# Patient Record
Sex: Female | Born: 1975 | Race: Black or African American | Hispanic: No | Marital: Married | Smoking: Never smoker
Health system: Southern US, Community
[De-identification: ages and names within clinical notes are randomized; demographics above are authoritative.]

## PROBLEM LIST (undated history)

## (undated) DIAGNOSIS — Z9289 Personal history of other medical treatment: Secondary | ICD-10-CM

## (undated) DIAGNOSIS — F419 Anxiety disorder, unspecified: Secondary | ICD-10-CM

## (undated) DIAGNOSIS — D649 Anemia, unspecified: Secondary | ICD-10-CM

## (undated) HISTORY — PX: LAPAROSCOPY: SHX197

## (undated) HISTORY — PX: DILATION AND CURETTAGE OF UTERUS: SHX78

---
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
## 2003-02-19 ENCOUNTER — Encounter: Admission: RE | Admit: 2003-02-19 | Discharge: 2003-02-19 | Payer: Self-pay | Admitting: Obstetrics and Gynecology

## 2005-01-26 ENCOUNTER — Other Ambulatory Visit: Admission: RE | Admit: 2005-01-26 | Discharge: 2005-01-26 | Payer: Self-pay | Admitting: Family Medicine

## 2006-02-08 ENCOUNTER — Other Ambulatory Visit: Admission: RE | Admit: 2006-02-08 | Discharge: 2006-02-08 | Payer: Self-pay | Admitting: Family Medicine

## 2007-03-21 ENCOUNTER — Other Ambulatory Visit: Admission: RE | Admit: 2007-03-21 | Discharge: 2007-03-21 | Payer: Self-pay | Admitting: Internal Medicine

## 2008-03-31 ENCOUNTER — Other Ambulatory Visit: Admission: RE | Admit: 2008-03-31 | Discharge: 2008-03-31 | Payer: Self-pay | Admitting: Obstetrics and Gynecology

## 2009-05-06 ENCOUNTER — Other Ambulatory Visit: Admission: RE | Admit: 2009-05-06 | Discharge: 2009-05-06 | Payer: Self-pay | Admitting: Obstetrics and Gynecology

## 2009-06-24 ENCOUNTER — Other Ambulatory Visit: Admission: RE | Admit: 2009-06-24 | Discharge: 2009-06-24 | Payer: Self-pay | Admitting: Obstetrics and Gynecology

## 2010-06-10 ENCOUNTER — Other Ambulatory Visit
Admission: RE | Admit: 2010-06-10 | Discharge: 2010-06-10 | Payer: Self-pay | Source: Home / Self Care | Admitting: Obstetrics and Gynecology

## 2010-08-08 ENCOUNTER — Other Ambulatory Visit: Payer: Self-pay | Admitting: Obstetrics and Gynecology

## 2010-08-08 DIAGNOSIS — Z1231 Encounter for screening mammogram for malignant neoplasm of breast: Secondary | ICD-10-CM

## 2010-08-18 ENCOUNTER — Ambulatory Visit
Admission: RE | Admit: 2010-08-18 | Discharge: 2010-08-18 | Disposition: A | Discharge status: Home / Self Care | Payer: PRIVATE HEALTH INSURANCE | Source: Ambulatory Visit | Attending: Obstetrics and Gynecology | Admitting: Obstetrics and Gynecology

## 2010-08-18 DIAGNOSIS — Z1231 Encounter for screening mammogram for malignant neoplasm of breast: Secondary | ICD-10-CM

## 2010-09-15 ENCOUNTER — Other Ambulatory Visit: Payer: Self-pay | Admitting: Obstetrics and Gynecology

## 2010-09-15 DIAGNOSIS — Z1231 Encounter for screening mammogram for malignant neoplasm of breast: Secondary | ICD-10-CM

## 2010-10-14 NOTE — Group Therapy Note (Signed)
NAME:  Desiree [name], MIRANDA NO.:  [id number]   MEDICAL RECORD NO.:  [id number]                   PATIENT TYPE:  OUT   LOCATION:  [facility]                           FACILITY:  [facility]   PHYSICIAN:  Tinnie Gens, MD                     DATE OF BIRTH:  1975-08-29   DATE OF SERVICE:  02/19/2003                                    CLINIC NOTE   CHIEF COMPLAINT:  Undesired fertility.   HISTORY OF PRESENT ILLNESS:  The patient is a 35 year old gravida 4 para 2-0-  2-2 who comes in today for definitive therapy for her persistent fertility.  She has been counseled at length about the permanency of this procedure and  risks and continues to desire sterilization.   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   PAST SURGICAL HISTORY:  She had laparoscopic surgery for possible PID/TOA  and she had cervical biopsies for abnormal Pap smear.   ALLERGIES:  SHELLFISH.   CURRENT MEDICATIONS:  Ortho-Novum 7/7/7.   GYNECOLOGICAL HISTORY:  Menarche is at age 65.  She has normal cycles on  OCs.  She has a history of abnormal Pap status post biopsy but no other  treatment for this.  Her last Pap smear was in August 2003 and within normal  limits.   OBSTETRICAL HISTORY:  She is a G4 P0-2-2 who had two previous vaginal  deliveries, the last of which was 15 months ago.   FAMILY HISTORY:  Significant for a mother with lupus and asthma but she is  most recently deceased with a CVA.   SOCIAL HISTORY:  She does not smoke.  She does drink alcohol on a weekly  basis.  Denies other drug use.   REVIEW OF SYSTEMS:  A 14-point review of systems is reviewed and is positive  for some fatigue and weight gain as well as vaginal itching.  She denies  nausea, vomiting, fevers, chills, chest pain, shortness of breath, abdominal  pain, diarrhea, constipation,  dysuria, hematuria, lower extremity swelling,  headaches.   PHYSICAL EXAMINATION:  VITAL SIGNS:  Pulse 75, blood pressure 114/73, weight  is 136.4.  GENERAL:  She is a well-developed, well-nourished African-American female in  no acute distress.  LUNGS:  Clear bilaterally.  CARDIOVASCULAR:  Regular rate and rhythm.  No rubs, gallops, or murmurs.  NECK:  Supple, normal thyroid.  ABDOMEN:  Soft, nontender, nondistended.  There is a well-healed scar at the  umbilicus.  GENITOURINARY:  She has normal external female genitalia.  The cervix is  visualized and is without lesion.  The vagina is rugated and clean.  The  uterus is small and anteverted.  The adnexa are without mass or tenderness.  EXTREMITIES:  Without cyanosis, clubbing, or edema.   IMPRESSION:  1. Multiparity.  2. Undesired  fertility.   PLAN:  Laparoscopic BTL.  The patient was counseled regarding the risks and  benefits of this procedure including the permanency of procedure, risk of  failure 1 in 200, increased risk of ectopic, as well as general risks and  benefits of surgery.  She does report that her 30-day papers are signed and  we will get this scheduled in approximately two weeks.                                               Tinnie Gens, MD    TP/MEDQ  D:  02/20/2003  T:  02/20/2003  Job:  [id number]

## 2011-06-29 ENCOUNTER — Other Ambulatory Visit: Payer: Self-pay | Admitting: Obstetrics and Gynecology

## 2011-06-29 ENCOUNTER — Other Ambulatory Visit (HOSPITAL_COMMUNITY)
Admission: RE | Admit: 2011-06-29 | Discharge: 2011-06-29 | Disposition: A | Discharge status: Home / Self Care | Payer: PRIVATE HEALTH INSURANCE | Source: Ambulatory Visit | Attending: Obstetrics and Gynecology | Admitting: Obstetrics and Gynecology

## 2011-06-29 DIAGNOSIS — Z113 Encounter for screening for infections with a predominantly sexual mode of transmission: Secondary | ICD-10-CM | POA: Insufficient documentation

## 2011-06-29 DIAGNOSIS — Z01419 Encounter for gynecological examination (general) (routine) without abnormal findings: Secondary | ICD-10-CM | POA: Insufficient documentation

## 2012-02-13 ENCOUNTER — Ambulatory Visit (HOSPITAL_COMMUNITY): Admission: RE | Admit: 2012-02-13 | Payer: PRIVATE HEALTH INSURANCE | Source: Ambulatory Visit

## 2012-02-13 ENCOUNTER — Emergency Department (HOSPITAL_COMMUNITY)
Admission: EM | Admit: 2012-02-13 | Discharge: 2012-02-13 | Discharge status: Against Medical Advice | Payer: Self-pay | Attending: Emergency Medicine | Admitting: Emergency Medicine

## 2012-02-13 ENCOUNTER — Encounter (HOSPITAL_COMMUNITY): Payer: Self-pay | Admitting: *Deleted

## 2012-02-13 DIAGNOSIS — M25529 Pain in unspecified elbow: Secondary | ICD-10-CM | POA: Insufficient documentation

## 2012-02-13 HISTORY — DX: Anxiety disorder, unspecified: F41.9
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
## 2012-02-13 NOTE — ED Notes (Signed)
The pt never returned

## 2012-02-13 NOTE — ED Notes (Signed)
The pt walked out of her room and into the hallway.  /// not coming back???????
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
## 2012-06-20 ENCOUNTER — Other Ambulatory Visit (HOSPITAL_COMMUNITY)
Admission: RE | Admit: 2012-06-20 | Discharge: 2012-06-20 | Disposition: A | Discharge status: Home / Self Care | Payer: BC Managed Care – PPO | Source: Ambulatory Visit | Attending: Obstetrics and Gynecology | Admitting: Obstetrics and Gynecology

## 2012-06-20 ENCOUNTER — Other Ambulatory Visit: Payer: Self-pay | Admitting: Obstetrics and Gynecology

## 2012-06-20 DIAGNOSIS — N760 Acute vaginitis: Secondary | ICD-10-CM | POA: Insufficient documentation

## 2012-06-20 DIAGNOSIS — Z1151 Encounter for screening for human papillomavirus (HPV): Secondary | ICD-10-CM | POA: Insufficient documentation

## 2012-06-20 DIAGNOSIS — Z01419 Encounter for gynecological examination (general) (routine) without abnormal findings: Secondary | ICD-10-CM | POA: Insufficient documentation

## 2012-06-20 DIAGNOSIS — Z113 Encounter for screening for infections with a predominantly sexual mode of transmission: Secondary | ICD-10-CM | POA: Insufficient documentation

## 2013-08-14 ENCOUNTER — Other Ambulatory Visit (HOSPITAL_COMMUNITY)
Admission: RE | Admit: 2013-08-14 | Discharge: 2013-08-14 | Disposition: A | Discharge status: Home / Self Care | Payer: BC Managed Care – PPO | Source: Ambulatory Visit | Attending: Obstetrics and Gynecology | Admitting: Obstetrics and Gynecology

## 2013-08-14 ENCOUNTER — Other Ambulatory Visit: Payer: Self-pay | Admitting: Obstetrics and Gynecology

## 2013-08-14 DIAGNOSIS — Z01419 Encounter for gynecological examination (general) (routine) without abnormal findings: Secondary | ICD-10-CM | POA: Insufficient documentation

## 2014-02-10 ENCOUNTER — Encounter (HOSPITAL_COMMUNITY): Payer: Self-pay | Admitting: *Deleted

## 2014-02-11 ENCOUNTER — Other Ambulatory Visit: Payer: Self-pay | Admitting: Obstetrics and Gynecology
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
## 2014-02-12 ENCOUNTER — Ambulatory Visit (HOSPITAL_COMMUNITY): Payer: BC Managed Care – PPO | Admitting: Anesthesiology

## 2014-02-12 ENCOUNTER — Ambulatory Visit (HOSPITAL_COMMUNITY)
Admission: RE | Admit: 2014-02-12 | Discharge: 2014-02-12 | Disposition: A | Discharge status: Home / Self Care | Payer: BC Managed Care – PPO | Source: Ambulatory Visit | Attending: Obstetrics and Gynecology | Admitting: Obstetrics and Gynecology

## 2014-02-12 ENCOUNTER — Encounter (HOSPITAL_COMMUNITY): Payer: Self-pay | Admitting: Anesthesiology

## 2014-02-12 ENCOUNTER — Encounter (HOSPITAL_COMMUNITY): Payer: BC Managed Care – PPO | Admitting: Anesthesiology

## 2014-02-12 ENCOUNTER — Encounter (HOSPITAL_COMMUNITY)
Admission: RE | Disposition: A | Discharge status: Home / Self Care | Payer: Self-pay | Source: Ambulatory Visit | Attending: Obstetrics and Gynecology

## 2014-02-12 DIAGNOSIS — N736 Female pelvic peritoneal adhesions (postinfective): Secondary | ICD-10-CM | POA: Insufficient documentation

## 2014-02-12 DIAGNOSIS — D649 Anemia, unspecified: Secondary | ICD-10-CM | POA: Insufficient documentation

## 2014-02-12 DIAGNOSIS — N949 Unspecified condition associated with female genital organs and menstrual cycle: Secondary | ICD-10-CM | POA: Diagnosis present

## 2014-02-12 DIAGNOSIS — N939 Abnormal uterine and vaginal bleeding, unspecified: Secondary | ICD-10-CM

## 2014-02-12 DIAGNOSIS — N925 Other specified irregular menstruation: Secondary | ICD-10-CM | POA: Diagnosis present

## 2014-02-12 DIAGNOSIS — N938 Other specified abnormal uterine and vaginal bleeding: Secondary | ICD-10-CM | POA: Diagnosis not present

## 2014-02-12 HISTORY — PX: LAPAROSCOPIC TUBAL LIGATION: SHX1937

## 2014-02-12 HISTORY — DX: Anemia, unspecified: D64.9

## 2014-02-12 HISTORY — PX: LAPAROSCOPIC LYSIS OF ADHESIONS: SHX5905

## 2014-02-12 HISTORY — PX: HYSTEROSCOPY: SHX211

## 2014-02-12 HISTORY — DX: Personal history of other medical treatment: Z92.89

## 2014-02-12 LAB — CBC
HEMATOCRIT: 36 % (ref 36.0–46.0)
HEMOGLOBIN: 12.3 g/dL (ref 12.0–15.0)
MCH: 32.4 pg (ref 26.0–34.0)
MCHC: 34.2 g/dL (ref 30.0–36.0)
MCV: 94.7 fL (ref 78.0–100.0)
Platelets: 272 10*3/uL (ref 150–400)
RBC: 3.8 MIL/uL — ABNORMAL LOW (ref 3.87–5.11)
RDW: 12.6 % (ref 11.5–15.5)
WBC: 6.8 10*3/uL (ref 4.0–10.5)

## 2014-02-12 LAB — PREGNANCY, URINE: PREG TEST UR: NEGATIVE

## 2014-02-12 SURGERY — LIGATION, FALLOPIAN TUBE, LAPAROSCOPIC
Anesthesia: General | Site: Vagina

## 2014-02-12 MED ORDER — DEXAMETHASONE SODIUM PHOSPHATE 4 MG/ML IJ SOLN
INTRAMUSCULAR | Status: AC
  Filled 2014-02-12: qty 2

## 2014-02-12 MED ORDER — FENTANYL CITRATE 0.05 MG/ML IJ SOLN
INTRAMUSCULAR | Status: AC
  Filled 2014-02-12: qty 2

## 2014-02-12 MED ORDER — KETOROLAC TROMETHAMINE 30 MG/ML IJ SOLN
INTRAMUSCULAR | Status: DC | PRN
  Administered 2014-02-12: 30 mg via INTRAVENOUS

## 2014-02-12 MED ORDER — OXYCODONE HCL 5 MG PO TABS: 5.0000 mg | ORAL_TABLET | Freq: Once | ORAL | Status: DC | PRN

## 2014-02-12 MED ORDER — MEPERIDINE HCL 25 MG/ML IJ SOLN: 6.2500 mg | INTRAMUSCULAR | Status: DC | PRN

## 2014-02-12 MED ORDER — HYDROCODONE-ACETAMINOPHEN 5-325 MG PO TABS: 1.0000 | ORAL_TABLET | Freq: Four times a day (QID) | ORAL | Status: DC | PRN

## 2014-02-12 MED ORDER — ROCURONIUM BROMIDE 100 MG/10ML IV SOLN
INTRAVENOUS | Status: AC
  Filled 2014-02-12: qty 1

## 2014-02-12 MED ORDER — FENTANYL CITRATE 0.05 MG/ML IJ SOLN
INTRAMUSCULAR | Status: DC | PRN
  Administered 2014-02-12: 50 ug via INTRAVENOUS
  Administered 2014-02-12: 100 ug via INTRAVENOUS
  Administered 2014-02-12 (×2): 50 ug via INTRAVENOUS

## 2014-02-12 MED ORDER — NEOSTIGMINE METHYLSULFATE 10 MG/10ML IV SOLN
INTRAVENOUS | Status: AC
  Filled 2014-02-12: qty 1

## 2014-02-12 MED ORDER — LACTATED RINGERS IV SOLN
INTRAVENOUS | Status: DC
  Administered 2014-02-12 (×2): via INTRAVENOUS

## 2014-02-12 MED ORDER — SCOPOLAMINE 1 MG/3DAYS TD PT72: 1.0000 | MEDICATED_PATCH | Freq: Once | TRANSDERMAL | Status: DC

## 2014-02-12 MED ORDER — DIPHENHYDRAMINE HCL 50 MG/ML IJ SOLN
INTRAMUSCULAR | Status: DC | PRN
  Administered 2014-02-12: 12.5 mg via INTRAVENOUS

## 2014-02-12 MED ORDER — LIDOCAINE HCL (CARDIAC) 20 MG/ML IV SOLN
INTRAVENOUS | Status: AC
  Filled 2014-02-12: qty 5

## 2014-02-12 MED ORDER — PROPOFOL 10 MG/ML IV EMUL
INTRAVENOUS | Status: AC
  Filled 2014-02-12: qty 20

## 2014-02-12 MED ORDER — FENTANYL CITRATE 0.05 MG/ML IJ SOLN
INTRAMUSCULAR | Status: AC
  Filled 2014-02-12: qty 5

## 2014-02-12 MED ORDER — DIPHENHYDRAMINE HCL 50 MG/ML IJ SOLN
INTRAMUSCULAR | Status: AC
  Filled 2014-02-12: qty 2

## 2014-02-12 MED ORDER — GLYCOPYRROLATE 0.2 MG/ML IJ SOLN
INTRAMUSCULAR | Status: AC
  Filled 2014-02-12: qty 3

## 2014-02-12 MED ORDER — SCOPOLAMINE 1 MG/3DAYS TD PT72
MEDICATED_PATCH | TRANSDERMAL | Status: DC
Start: 2014-02-12 — End: 2014-02-12
  Administered 2014-02-12: 1.5 mg via TRANSDERMAL
  Filled 2014-02-12: qty 1

## 2014-02-12 MED ORDER — BUPIVACAINE HCL (PF) 0.25 % IJ SOLN
INTRAMUSCULAR | Status: DC | PRN
  Administered 2014-02-12: 10 mL

## 2014-02-12 MED ORDER — IBUPROFEN 800 MG PO TABS: 800.0000 mg | ORAL_TABLET | Freq: Three times a day (TID) | ORAL | Status: DC | PRN

## 2014-02-12 MED ORDER — GLYCOPYRROLATE 0.2 MG/ML IJ SOLN
INTRAMUSCULAR | Status: DC | PRN
  Administered 2014-02-12: 0.4 mg via INTRAVENOUS
  Administered 2014-02-12: 0.6 mg via INTRAVENOUS

## 2014-02-12 MED ORDER — MIDAZOLAM HCL 2 MG/2ML IJ SOLN
INTRAMUSCULAR | Status: DC | PRN
  Administered 2014-02-12: 2 mg via INTRAVENOUS

## 2014-02-12 MED ORDER — HYDROCODONE-ACETAMINOPHEN 5-325 MG PO TABS
ORAL_TABLET | ORAL | Status: AC
  Filled 2014-02-12: qty 1

## 2014-02-12 MED ORDER — ONDANSETRON HCL 4 MG/2ML IJ SOLN
INTRAMUSCULAR | Status: AC
  Filled 2014-02-12: qty 2

## 2014-02-12 MED ORDER — ROCURONIUM BROMIDE 100 MG/10ML IV SOLN
INTRAVENOUS | Status: DC | PRN
  Administered 2014-02-12: 35 mg via INTRAVENOUS
  Administered 2014-02-12: 15 mg via INTRAVENOUS

## 2014-02-12 MED ORDER — ONDANSETRON HCL 4 MG/2ML IJ SOLN
INTRAMUSCULAR | Status: DC | PRN
  Administered 2014-02-12: 4 mg via INTRAVENOUS

## 2014-02-12 MED ORDER — SCOPOLAMINE 1 MG/3DAYS TD PT72
1.0000 | MEDICATED_PATCH | TRANSDERMAL | Status: DC
  Administered 2014-02-12: 1.5 mg via TRANSDERMAL

## 2014-02-12 MED ORDER — FENTANYL CITRATE 0.05 MG/ML IJ SOLN
25.0000 ug | INTRAMUSCULAR | Status: DC | PRN
  Administered 2014-02-12: 25 ug via INTRAVENOUS

## 2014-02-12 MED ORDER — HYDROMORPHONE HCL 1 MG/ML IJ SOLN
INTRAMUSCULAR | Status: AC
  Filled 2014-02-12: qty 1

## 2014-02-12 MED ORDER — HYDROCODONE-ACETAMINOPHEN 5-325 MG PO TABS
1.0000 | ORAL_TABLET | Freq: Once | ORAL | Status: AC
  Administered 2014-02-12: 1 via ORAL

## 2014-02-12 MED ORDER — KETOROLAC TROMETHAMINE 30 MG/ML IJ SOLN
INTRAMUSCULAR | Status: AC
  Filled 2014-02-12: qty 1

## 2014-02-12 MED ORDER — MIDAZOLAM HCL 2 MG/2ML IJ SOLN
INTRAMUSCULAR | Status: AC
  Filled 2014-02-12: qty 2

## 2014-02-12 MED ORDER — PROPOFOL 10 MG/ML IV BOLUS
INTRAVENOUS | Status: DC | PRN
  Administered 2014-02-12: 170 mg via INTRAVENOUS

## 2014-02-12 MED ORDER — OXYCODONE HCL 5 MG/5ML PO SOLN: 5.0000 mg | Freq: Once | ORAL | Status: DC | PRN

## 2014-02-12 MED ORDER — DOXYCYCLINE HYCLATE 50 MG PO CAPS: 100.0000 mg | ORAL_CAPSULE | Freq: Every day | ORAL | Status: DC

## 2014-02-12 MED ORDER — METOCLOPRAMIDE HCL 5 MG/ML IJ SOLN: 10.0000 mg | Freq: Once | INTRAMUSCULAR | Status: DC | PRN

## 2014-02-12 MED ORDER — DEXAMETHASONE SODIUM PHOSPHATE 10 MG/ML IJ SOLN
INTRAMUSCULAR | Status: DC | PRN
  Administered 2014-02-12: 4 mg via INTRAVENOUS

## 2014-02-12 MED ORDER — HYDROMORPHONE HCL 1 MG/ML IJ SOLN
INTRAMUSCULAR | Status: DC | PRN
  Administered 2014-02-12: 1 mg via INTRAVENOUS

## 2014-02-12 MED ORDER — NEOSTIGMINE METHYLSULFATE 10 MG/10ML IV SOLN
INTRAVENOUS | Status: DC | PRN
  Administered 2014-02-12: 2 mg via INTRAVENOUS
  Administered 2014-02-12: 3 mg via INTRAVENOUS

## 2014-02-12 MED ORDER — DEXAMETHASONE SODIUM PHOSPHATE 10 MG/ML IJ SOLN
INTRAMUSCULAR | Status: AC
  Filled 2014-02-12: qty 1

## 2014-02-12 MED ORDER — LIDOCAINE HCL (CARDIAC) 20 MG/ML IV SOLN
INTRAVENOUS | Status: DC | PRN
  Administered 2014-02-12: 80 mg via INTRAVENOUS

## 2014-02-12 MED ORDER — LACTATED RINGERS IR SOLN
Status: DC | PRN
  Administered 2014-02-12: 3000 mL

## 2014-02-12 SURGICAL SUPPLY — 33 items
ADH SKN CLS APL DERMABOND .7 (GAUZE/BANDAGES/DRESSINGS) ×3
APPLICATOR COTTON TIP 6IN STRL (MISCELLANEOUS) ×5 IMPLANT
CANISTER SUCT 3000ML (MISCELLANEOUS) ×5 IMPLANT
CATH ROBINSON RED A/P 16FR (CATHETERS) ×5 IMPLANT
CLOTH BEACON ORANGE TIMEOUT ST (SAFETY) ×5 IMPLANT
CONTAINER PREFILL 10% NBF 60ML (FORM) ×10 IMPLANT
DERMABOND ADVANCED (GAUZE/BANDAGES/DRESSINGS) ×2
DERMABOND ADVANCED .7 DNX12 (GAUZE/BANDAGES/DRESSINGS) ×3 IMPLANT
DILATOR CANAL MILEX (MISCELLANEOUS) IMPLANT
DRAPE HYSTEROSCOPY (DRAPE) ×5 IMPLANT
DRSG COVADERM PLUS 2X2 (GAUZE/BANDAGES/DRESSINGS) ×10 IMPLANT
DRSG OPSITE POSTOP 3X4 (GAUZE/BANDAGES/DRESSINGS) ×2 IMPLANT
ELECT REM PT RETURN 9FT ADLT (ELECTROSURGICAL)
ELECTRODE REM PT RTRN 9FT ADLT (ELECTROSURGICAL) IMPLANT
FORCEPS CUTTING 45CM 5MM (CUTTING FORCEPS) ×5 IMPLANT
GLOVE BIOGEL M 6.5 STRL (GLOVE) ×10 IMPLANT
GLOVE BIOGEL PI IND STRL 6.5 (GLOVE) ×3 IMPLANT
GLOVE BIOGEL PI INDICATOR 6.5 (GLOVE) ×2
GOWN STRL REUS W/TWL LRG LVL3 (GOWN DISPOSABLE) ×10 IMPLANT
PACK LAPAROSCOPY BASIN (CUSTOM PROCEDURE TRAY) ×5 IMPLANT
PACK VAGINAL MINOR WOMEN LF (CUSTOM PROCEDURE TRAY) ×5 IMPLANT
PAD OB MATERNITY 4.3X12.25 (PERSONAL CARE ITEMS) ×5 IMPLANT
SCISSORS LAP 5X35 DISP (ENDOMECHANICALS) ×2 IMPLANT
SET GENESYS HTA PROCERVA (MISCELLANEOUS) IMPLANT
SET IRRIG TUBING LAPAROSCOPIC (IRRIGATION / IRRIGATOR) ×2 IMPLANT
SOLUTION ELECTROLUBE (MISCELLANEOUS) ×5 IMPLANT
SUT VIC AB 4-0 PS2 18 (SUTURE) ×2 IMPLANT
SUT VICRYL 0 UR6 27IN ABS (SUTURE) ×5 IMPLANT
SUT VICRYL 4-0 PS2 18IN ABS (SUTURE) ×5 IMPLANT
TOWEL OR 17X24 6PK STRL BLUE (TOWEL DISPOSABLE) ×10 IMPLANT
TROCAR XCEL NON-BLD 11X100MML (ENDOMECHANICALS) ×5 IMPLANT
WARMER LAPAROSCOPE (MISCELLANEOUS) ×5 IMPLANT
WATER STERILE IRR 1000ML POUR (IV SOLUTION) ×5 IMPLANT

## 2014-02-12 NOTE — Anesthesia Preprocedure Evaluation (Addendum)
Anesthesia Evaluation  Patient identified by MRN, date of birth, ID band Patient awake    Reviewed: Allergy & Precautions, H&P , NPO status , Patient's Chart, lab work & pertinent test results  Airway Mallampati: II TM Distance: >3 FB Neck ROM: full    Dental  (+) Teeth Intact   Pulmonary neg pulmonary ROS,  breath sounds clear to auscultation        Cardiovascular negative cardio ROS  Rhythm:regular Rate:Normal     Neuro/Psych Anxiety negative neurological ROS  negative psych ROS   GI/Hepatic negative GI ROS, Neg liver ROS,   Endo/Other  negative endocrine ROS  Renal/GU negative Renal ROS  negative genitourinary   Musculoskeletal negative musculoskeletal ROS (+)   Abdominal   Peds  Hematology negative hematology ROS (+) anemia ,   Anesthesia Other Findings   Reproductive/Obstetrics negative OB ROS AUB Desires permanent contraception                          Anesthesia Physical Anesthesia Plan  ASA: II  Anesthesia Plan: General   Post-op Pain Management:    Induction: Intravenous  Airway Management Planned: Oral ETT  Additional Equipment:   Intra-op Plan:   Post-operative Plan: Extubation in OR  Informed Consent: I have reviewed the patients History and Physical, chart, labs and discussed the procedure including the risks, benefits and alternatives for the proposed anesthesia with the patient or authorized representative who has indicated his/her understanding and acceptance.   Dental advisory given  Plan Discussed with: Anesthesiologist, CRNA and Surgeon  Anesthesia Plan Comments:         Anesthesia Quick Evaluation

## 2014-02-12 NOTE — H&P (Signed)
Date of Initial H&P: 02/11/2014  History reviewed, patient examined, no change in status, stable for surgery. Discussed with patient plan for currettage after ablation to r/o malignancy.. She is in agreement.

## 2014-02-12 NOTE — Anesthesia Postprocedure Evaluation (Signed)
  Anesthesia Post-op Note  Patient: Desiree [name]  Procedure(s) Performed: Procedure(s): ATTEMPTED  LAPAROSCOPIC TUBAL LIGATION (Bilateral) HYSTEROSCOPY WITH HYDROTHERMAL ABLATION (N/A) EXTENSIVE LAPAROSCOPIC LYSIS OF ADHESIONS (N/A)  Patient Location: PACU  Anesthesia Type:General  Level of Consciousness: awake, alert  and oriented  Airway and Oxygen Therapy: Patient Spontanous Breathing  Post-op Pain: mild  Post-op Assessment: Post-op Vital signs reviewed, Patient's Cardiovascular Status Stable, Respiratory Function Stable, Patent Airway, No signs of Nausea or vomiting and Pain level controlled  Post-op Vital Signs: Reviewed and stable  Last Vitals:  Filed Vitals:   02/12/14 1140  BP: 128/82  Pulse: 76  Temp: 36.7 C  Resp: 16    Complications: No apparent anesthesia complications

## 2014-02-12 NOTE — Op Note (Signed)
02/12/2014  9:40 AM  PATIENT:  Desiree [name]  38 y.o. female  PRE-OPERATIVE DIAGNOSIS:  Contraceptive Management/AUB  POST-OPERATIVE DIAGNOSIS:  Contraceptive Management/AUB  PROCEDURE:  Procedure(s): ATTEMPTED  LAPAROSCOPIC TUBAL LIGATION (Bilateral) HYSTEROSCOPY WITH HYDROTHERMAL ABLATION (N/A) EXTENSIVE LAPAROSCOPIC LYSIS OF ADHESIONS (N/A)  SURGEON:  Surgeon(s) and Role:    * Zahria Ding J. Richardson Dopp, MD - Primary  PHYSICIAN ASSISTANT: None  ASSISTANTS: Kristen   ANESTHESIA:   general  EBL: 20 cc   Total I/O In: 1000 [I.V.:1000] Out: 320 [Urine:300; Blood:20]  BLOOD ADMINISTERED:none  DRAINS: none   LOCAL MEDICATIONS USED:  MARCAINE     SPECIMEN:  Source of Specimen:  endometrial currettings   DISPOSITION OF SPECIMEN:  PATHOLOGY  COUNTS:  correcct  TOURNIQUET:  * No tourniquets in log *  DICTATION: .Dragon Dictation  PLAN OF CARE: Discharge to home after PACU  PATIENT DISPOSITION:  PACU - hemodynamically stable.   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 I was unable to visualize the uterus ovaries and fallopian tubes due to the dense adhesions. The endometrium was slightly proliferative,   Procedure: The patient was taken to the operating room where she was placed under general anesthesia. She is placed in the dorsolithotomy position. She was prepped and draped in the usual sterile fashion. Speculum was placed into the vaginal vault. In the anterior lip of the cervix grasped with a single-tooth tenaculum. A uterine manipulator was placed without difficulty. The single-tooth tenaculum was  Removed. The speculum was removed.  Attention was turned to the umbilicus which was injected with 10 cc of quarter percent Marcaine. A 11 mm incision was made with the scalpel. A 11 mm trocar was placed under  direct visualization. Entry into the peritoneum was visualized. The pneumoperitoneum was achieved with CO2 gas.The adhesions were noted as mentioned above. A 5 mm incision was made in the left lower quadrant. A 5 mm trocar was placed under direct visualization. Laparoscope scissors were used to excise a majority of the pelvic adhesions. The Adhesions of the bowel to the anterior abdominal wall in the lower pelvis were dense. i decided to abort the attempt of tubal ligation due to the dense adhesions. The pelvis was irrigated.Marland Kitchen Hemostasis was noted.    Pneumoperitoneum was released. The left lower quadrant trocar and the  umbilical trocar was removed under direct visualization. The fascia  Of the umbilcal incision was closed with 0 Vicryl. The skin was closed with 4-0 Vicryl. The dermabond placed a over the skin incision.   Attention was turned to the vagina. A speculum was placed in the vaginal vault. The uterine manipulator was removed. The anterior lip of the cervix was grasped with a single tooth tenaculum. The uterus was sounded to 8 cm. The cervix was dilated to 8 mm. The hydrothermal hysteroscope was inserted. The ostia were visualized bilaterally. There was no evidence of perforation. The hydrothermal ablation was performed for 10 minutes.  The hysteroscope was removed.The single tooth tenaculum was removed.  Excellent hemostasis was noted.   Sponge lap and needle counts were correct x 2.  The patient was awakened from anesthesia and taken to the recovery room in stable condition.

## 2014-02-12 NOTE — Transfer of Care (Signed)
Immediate Anesthesia Transfer of Care Note  Patient: Desiree [name]  Procedure(s) Performed: Procedure(s): ATTEMPTED  LAPAROSCOPIC TUBAL LIGATION (Bilateral) HYSTEROSCOPY WITH HYDROTHERMAL ABLATION (N/A) EXTENSIVE LAPAROSCOPIC LYSIS OF ADHESIONS (N/A)  Patient Location: PACU  Anesthesia Type:General  Level of Consciousness: awake, alert  and oriented  Airway & Oxygen Therapy: Patient Spontanous Breathing and Patient connected to nasal cannula oxygen  Post-op Assessment: Report given to PACU RN, Post -op Vital signs reviewed and stable and Patient moving all extremities  Post vital signs: Reviewed and stable  Complications: No apparent anesthesia complications

## 2014-02-12 NOTE — Discharge Instructions (Signed)

## 2014-02-13 ENCOUNTER — Encounter (HOSPITAL_COMMUNITY): Payer: Self-pay | Admitting: Obstetrics and Gynecology

## 2014-02-14 ENCOUNTER — Telehealth: Payer: Self-pay | Admitting: Obstetrics and Gynecology

## 2014-02-14 NOTE — Telephone Encounter (Signed)
TC from patient--patient of Dr. Richardson Dopp, with attempted L/S tubal on 02/12/14 that was aborted due to adhesions, hysteroscopy with hydrothermal ablation, extensive laparoscopic lysis of adhesions. No BM since Wednesday before surgery, also has mild phlegmy cough.  Denies SOB, chest pain, fever, or any other sx.  No significant pain, taking Motrin.  Took Vicodin x 1 yesterday. Recommended pushing fluids, take mild laxative (she has used before), ambulate, may take Mucinex per her request. To call with any worsening of sx or any other issues.  Nigel Bridgeman, CNM 02/14/14 [phone]

## 2014-03-13 ENCOUNTER — Other Ambulatory Visit: Payer: Self-pay

## 2014-03-22 ENCOUNTER — Encounter (HOSPITAL_COMMUNITY): Payer: Self-pay | Admitting: Emergency Medicine

## 2014-03-22 ENCOUNTER — Emergency Department (HOSPITAL_COMMUNITY): Payer: BC Managed Care – PPO

## 2014-03-22 DIAGNOSIS — R110 Nausea: Secondary | ICD-10-CM | POA: Diagnosis not present

## 2014-03-22 DIAGNOSIS — F419 Anxiety disorder, unspecified: Secondary | ICD-10-CM | POA: Diagnosis not present

## 2014-03-22 DIAGNOSIS — R55 Syncope and collapse: Secondary | ICD-10-CM | POA: Diagnosis not present

## 2014-03-22 DIAGNOSIS — R0789 Other chest pain: Secondary | ICD-10-CM | POA: Diagnosis not present

## 2014-03-22 DIAGNOSIS — Z862 Personal history of diseases of the blood and blood-forming organs and certain disorders involving the immune mechanism: Secondary | ICD-10-CM | POA: Diagnosis not present

## 2014-03-22 DIAGNOSIS — R079 Chest pain, unspecified: Secondary | ICD-10-CM | POA: Diagnosis present

## 2014-03-22 DIAGNOSIS — Z79899 Other long term (current) drug therapy: Secondary | ICD-10-CM | POA: Diagnosis not present

## 2014-03-22 DIAGNOSIS — R0600 Dyspnea, unspecified: Secondary | ICD-10-CM | POA: Insufficient documentation

## 2014-03-22 DIAGNOSIS — Z880 Allergy status to penicillin: Secondary | ICD-10-CM | POA: Diagnosis not present

## 2014-03-22 LAB — CBC WITH DIFFERENTIAL/PLATELET
BASOS ABS: 0.1 10*3/uL (ref 0.0–0.1)
Basophils Relative: 0 % (ref 0–1)
Eosinophils Absolute: 0.2 10*3/uL (ref 0.0–0.7)
Eosinophils Relative: 1 % (ref 0–5)
HCT: 39.2 % (ref 36.0–46.0)
Hemoglobin: 13.8 g/dL (ref 12.0–15.0)
LYMPHS PCT: 31 % (ref 12–46)
Lymphs Abs: 3.6 10*3/uL (ref 0.7–4.0)
MCH: 31.7 pg (ref 26.0–34.0)
MCHC: 35.2 g/dL (ref 30.0–36.0)
MCV: 90.1 fL (ref 78.0–100.0)
Monocytes Absolute: 0.6 10*3/uL (ref 0.1–1.0)
Monocytes Relative: 5 % (ref 3–12)
NEUTROS ABS: 7.3 10*3/uL (ref 1.7–7.7)
NEUTROS PCT: 63 % (ref 43–77)
Platelets: 346 10*3/uL (ref 150–400)
RBC: 4.35 MIL/uL (ref 3.87–5.11)
RDW: 12.2 % (ref 11.5–15.5)
WBC: 11.8 10*3/uL — AB (ref 4.0–10.5)

## 2014-03-22 LAB — COMPREHENSIVE METABOLIC PANEL
ALBUMIN: 4.1 g/dL (ref 3.5–5.2)
ALT: 19 U/L (ref 0–35)
AST: 24 U/L (ref 0–37)
Alkaline Phosphatase: 76 U/L (ref 39–117)
Anion gap: 19 — ABNORMAL HIGH (ref 5–15)
BILIRUBIN TOTAL: 0.3 mg/dL (ref 0.3–1.2)
BUN: 17 mg/dL (ref 6–23)
CHLORIDE: 96 meq/L (ref 96–112)
CO2: 20 mEq/L (ref 19–32)
CREATININE: 1.08 mg/dL (ref 0.50–1.10)
Calcium: 10.5 mg/dL (ref 8.4–10.5)
GFR calc Af Amer: 74 mL/min — ABNORMAL LOW (ref >90)
GFR calc non Af Amer: 64 mL/min — ABNORMAL LOW (ref >90)
Glucose, Bld: 87 mg/dL (ref 70–99)
POTASSIUM: 3.3 meq/L — AB (ref 3.7–5.3)
Sodium: 135 mEq/L — ABNORMAL LOW (ref 137–147)
Total Protein: 8.5 g/dL — ABNORMAL HIGH (ref 6.0–8.3)

## 2014-03-22 LAB — LIPASE, BLOOD: Lipase: 28 U/L (ref 11–59)

## 2014-03-22 LAB — I-STAT TROPONIN, ED: Troponin i, poc: 0 ng/mL (ref 0.00–0.08)

## 2014-03-22 LAB — I-STAT BETA HCG BLOOD, ED (MC, WL, AP ONLY)

## 2014-03-22 NOTE — ED Notes (Signed)
Remains alert, NAD, calmer, interactive, using smart phone, [facility], pending lab results and CXR. Denies nausea at this time.

## 2014-03-22 NOTE — ED Notes (Signed)
Pt tearful, upset crying, red eyes, calmer now.  Here for episodes x2 of CP,sob, tachycardia, nausea, light headed, breathing hard and "felt like passing out". First episode started at 1100am, 2nd episode started at ~ 2030pm. Lasted ~ 10 minutes. Denies pain, describes as chest discomfort. No h/o same. (denies: pain, cough, congestion, cold sx, bleeding, fever, vd, sick contacts), [facility] "was not able to complete tubal ligation". Works in a [employer]. Here with family.

## 2014-03-22 NOTE — ED Notes (Signed)
To x-ray

## 2014-03-22 NOTE — ED Notes (Signed)
States, "feel like I have air in my chest that I need to burp up".

## 2014-03-23 ENCOUNTER — Emergency Department (HOSPITAL_COMMUNITY)
Admission: EM | Admit: 2014-03-23 | Discharge: 2014-03-23 | Disposition: A | Discharge status: Home / Self Care | Payer: BC Managed Care – PPO | Attending: Emergency Medicine | Admitting: Emergency Medicine

## 2014-03-23 DIAGNOSIS — R0600 Dyspnea, unspecified: Secondary | ICD-10-CM

## 2014-03-23 DIAGNOSIS — R42 Dizziness and giddiness: Secondary | ICD-10-CM

## 2014-03-23 DIAGNOSIS — R0789 Other chest pain: Secondary | ICD-10-CM

## 2014-03-23 DIAGNOSIS — R0602 Shortness of breath: Secondary | ICD-10-CM

## 2014-03-23 LAB — D-DIMER, QUANTITATIVE (NOT AT ARMC): D-Dimer, Quant: <0.27 ug/mL-FEU (ref 0.00–0.48)

## 2014-03-23 LAB — I-STAT TROPONIN, ED: TROPONIN I, POC: 0 ng/mL (ref 0.00–0.08)

## 2014-03-23 NOTE — ED Provider Notes (Signed)
CSN: [id number]     Arrival date & time 03/22/14  2111 History   First MD Initiated Contact with Patient 03/23/14 0202     Chief Complaint  Patient presents with  . Tachycardia  . Shortness of Breath  . Chest Pain  . Dizziness  . Nausea  . 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 Symptoms were transient lasting 10 minutes. Patient currently has no symptoms on my exam, denies chest pain shortness of breath cough fever. Patient had surgery 4 weeks prior for ablation and tubal ligation. Patient denies active cancer, leg swelling or leg pain, blood clot history, cardiac history.  Patient is a 38 y.o. female presenting with shortness of breath, chest pain, dizziness, and near-syncope. The history is provided by the patient.  Shortness of Breath Associated symptoms: chest pain   Associated symptoms: no abdominal pain, no fever, no headaches, no neck pain, no rash and no vomiting   Chest Pain Associated symptoms: near-syncope and shortness of breath   Associated symptoms: no abdominal pain, no back pain, no dizziness, no fever, no headache and not vomiting   Dizziness Associated symptoms: chest pain and shortness of breath   Associated symptoms: no headaches and no vomiting   Near Syncope Associated symptoms include chest pain and shortness of breath. Pertinent negatives include no abdominal pain and no headaches.    Past Medical History  Diagnosis Date  . Anxiety   . Anemia     History as teenager  . History of blood transfusion 1993    [place] - not sure number of units transfused  . SVD (spontaneous vaginal delivery)    Past Surgical History  Procedure Laterality Date  . Laparoscopy      cervix  . Dilation and curettage of  uterus    . Laparoscopic tubal ligation Bilateral 02/12/2014    Procedure: ATTEMPTED  LAPAROSCOPIC TUBAL LIGATION;  Surgeon: Dorien Chihuahua. Richardson Dopp, MD;  Location: [facility];  Service: Gynecology;  Laterality: Bilateral;  . Hysteroscopy N/A 02/12/2014    Procedure: HYSTEROSCOPY WITH HYDROTHERMAL ABLATION;  Surgeon: Dorien Chihuahua. Richardson Dopp, MD;  Location: [facility];  Service: Gynecology;  Laterality: N/A;  . Laparoscopic lysis of adhesions N/A 02/12/2014    Procedure: EXTENSIVE LAPAROSCOPIC LYSIS OF ADHESIONS;  Surgeon: Dorien Chihuahua. Richardson Dopp, MD;  Location: [facility];  Service: Gynecology;  Laterality: N/A;   No family history on file. History  Substance Use Topics  . Smoking status: Never Smoker   . Smokeless tobacco: Never Used  . Alcohol Use: Yes     Comment: daily - vodka 1-2 daily, weekends more   OB History   Grav Para Term Preterm Abortions TAB SAB Ect Mult Living                 Review of Systems  Constitutional: Negative for fever and chills.  HENT: Negative for congestion.   Eyes: Negative for visual disturbance.  Respiratory: Positive for shortness of breath.   Cardiovascular: Positive for chest pain and near-syncope. Negative for leg swelling.  Gastrointestinal: Negative for vomiting and abdominal pain.  Genitourinary: Negative for dysuria and flank pain.  Musculoskeletal: Negative for back pain, neck pain and neck stiffness.  Skin: Negative for rash.  Neurological: Positive for light-headedness. Negative for dizziness and headaches.      Allergies  Iodine; Shellfish allergy; Dicloxacillin; and Penicillins cross reactors  Home Medications   Prior to Admission medications   Medication Sig Start Date End Date Taking? Authorizing Provider  clonazePAM (KLONOPIN) 0.5 MG tablet Take 1 mg by mouth daily as needed. For anxiety   Yes Historical Provider, MD  phentermine (ADIPEX-P) 37.5 MG tablet Take 18.75 mg by mouth daily before breakfast.   Yes Historical Provider, MD  zolpidem (AMBIEN) 10 MG tablet Take 10  mg by mouth at bedtime as needed. For sleep   Yes Historical Provider, MD   BP 119/83  Pulse 91  Temp(Src) 98.3 F (36.8 C) (Oral)  Resp 19  Ht 5\' 4"  (1.626 m)  Wt 165 lb (74.844 kg)  BMI 28.31 kg/m2  SpO2 99%  LMP 03/05/2014 Physical Exam  Nursing note and vitals reviewed. Constitutional: She is oriented to person, place, and time. She appears well-developed and well-nourished.  HENT:  Head: Normocephalic and atraumatic.  Eyes: Conjunctivae are normal. Right eye exhibits no discharge. Left eye exhibits no discharge.  Neck: Normal range of motion. Neck supple. No tracheal deviation present.  Cardiovascular: Normal rate and regular rhythm.   Pulmonary/Chest: Effort normal and breath sounds normal.  Abdominal: Soft. She exhibits no distension. There is no tenderness. There is no guarding.  Musculoskeletal: She exhibits no edema and no tenderness.  Neurological: She is alert and oriented to person, place, and time.  Skin: Skin is warm. No rash noted.  Psychiatric: She has a normal mood and affect.    ED Course  Procedures (including critical care time) Labs Review Labs Reviewed  CBC WITH DIFFERENTIAL - Abnormal; Notable for the following:    WBC 11.8 (*)    All other components within normal limits  COMPREHENSIVE METABOLIC PANEL - Abnormal; Notable for the following:    Sodium 135 (*)    Potassium 3.3 (*)    Total Protein 8.5 (*)    GFR calc non Af Amer 64 (*)    GFR calc Af Amer 74 (*)    Anion gap 19 (*)    All other components within normal limits  LIPASE, BLOOD  D-DIMER, QUANTITATIVE  I-STAT BETA HCG BLOOD, ED ([facility], [facility], [facility] ONLY)  I-STAT TROPOININ, ED  I-STAT TROPOININ, ED    Imaging Review Dg Chest 2 View  03/22/2014   CLINICAL DATA:  Initial evaluation for acute chest pain, shortness of breath  EXAM: CHEST  2 VIEW  COMPARISON:  None available.  FINDINGS: The cardiac and mediastinal silhouettes are within normal limits.  The lungs are normally inflated. No  airspace consolidation, pleural effusion, or pulmonary edema is identified. There is no pneumothorax.  No acute osseous abnormality identified.  IMPRESSION: No active cardiopulmonary disease.   Electronically Signed   By: Rise MuBenjamin  McClintock M.D.   On: 03/22/2014 22:53     EKG Interpretation   Date/Time:  Sunday March 22 2014 21:20:24 EDT Ventricular Rate:  119 PR Interval:  130 QRS Duration: 78 QT Interval:  310 QTC Calculation: 436 R Axis:   59 Text Interpretation:  Sinus tachycardia poor baseline Confirmed by Xzavien Harada   MD, Onur Mori ([id number]) on 03/23/2014 1:33:26 AM      MDM   Final diagnoses:  Dyspnea  Lightheaded  Chest pressure   Patient very low risk cardiac, low risk blood clot only risk factor would be recent surgery. Patient initially tachycardic and mild shortness of breath and pressure, all symptoms resolved with time. EKG no acute heart strain, chest x-ray reviewed no acute infiltrate, blood work unremarkable except d-dimer pending. Discussed CT  scan if d-dimer positive, if negative close outpatient follow-up.  D-dimer negative, patient asymptomatic in the ER. Follow up outpatient. Results and differential diagnosis were discussed with the patient/parent/guardian. Close follow up outpatient was discussed, comfortable with the plan.   Medications - No data to display  Filed Vitals:   03/23/14 0300 03/23/14 0330 03/23/14 0345 03/23/14 0352  BP: 115/81 122/79 119/83   Pulse:  94 91   Temp:    98.3 F (36.8 C)  TempSrc:    Oral  Resp: 15 16 19    Height:      Weight:      SpO2:  100% 99%     Final diagnoses:  Dyspnea  Lightheaded  Chest pressure        Enid SkeensJoshua M Olivene Cookston, MD 03/23/14 [phone]

## 2014-03-23 NOTE — Discharge Instructions (Signed)
If you were given medicines take as directed.  If you are on coumadin or contraceptives realize their levels and effectiveness is altered by many different medicines.  If you have any reaction (rash, tongues swelling, other) to the medicines stop taking and see a physician.   Please follow up as directed and return to the ER or see a physician for new or worsening symptoms.  Thank you. Filed Vitals:   03/23/14 0300 03/23/14 0330 03/23/14 0345 03/23/14 0352  BP: 115/81 122/79 119/83   Pulse:  94 91   Temp:    98.3 F (36.8 C)  TempSrc:    Oral  Resp: 15 16 19    Height:      Weight:      SpO2:  100% 99%

## 2014-09-03 ENCOUNTER — Other Ambulatory Visit: Payer: Self-pay | Admitting: Obstetrics and Gynecology

## 2014-09-03 ENCOUNTER — Other Ambulatory Visit (HOSPITAL_COMMUNITY)
Admission: RE | Admit: 2014-09-03 | Discharge: 2014-09-03 | Disposition: A | Discharge status: Home / Self Care | Payer: BLUE CROSS/BLUE SHIELD | Source: Ambulatory Visit | Attending: Obstetrics and Gynecology | Admitting: Obstetrics and Gynecology

## 2014-09-03 DIAGNOSIS — Z01419 Encounter for gynecological examination (general) (routine) without abnormal findings: Secondary | ICD-10-CM | POA: Diagnosis not present

## 2014-09-04 LAB — CYTOLOGY - PAP

## 2015-09-02 ENCOUNTER — Other Ambulatory Visit (HOSPITAL_COMMUNITY)
Admission: RE | Admit: 2015-09-02 | Discharge: 2015-09-02 | Disposition: A | Discharge status: Home / Self Care | Payer: BLUE CROSS/BLUE SHIELD | Source: Ambulatory Visit | Attending: Obstetrics and Gynecology | Admitting: Obstetrics and Gynecology

## 2015-09-02 ENCOUNTER — Other Ambulatory Visit: Payer: Self-pay | Admitting: Obstetrics and Gynecology

## 2015-09-02 DIAGNOSIS — Z01419 Encounter for gynecological examination (general) (routine) without abnormal findings: Secondary | ICD-10-CM | POA: Insufficient documentation

## 2015-09-02 DIAGNOSIS — Z1151 Encounter for screening for human papillomavirus (HPV): Secondary | ICD-10-CM | POA: Insufficient documentation

## 2015-09-03 LAB — CYTOLOGY - PAP

## 2015-09-07 ENCOUNTER — Other Ambulatory Visit: Payer: Self-pay

## 2015-09-07 DIAGNOSIS — Z1231 Encounter for screening mammogram for malignant neoplasm of breast: Secondary | ICD-10-CM

## 2015-09-23 ENCOUNTER — Ambulatory Visit
Admission: RE | Admit: 2015-09-23 | Discharge: 2015-09-23 | Disposition: A | Discharge status: Home / Self Care | Payer: BLUE CROSS/BLUE SHIELD | Source: Ambulatory Visit

## 2015-09-23 DIAGNOSIS — Z1231 Encounter for screening mammogram for malignant neoplasm of breast: Secondary | ICD-10-CM

## 2016-11-01 ENCOUNTER — Other Ambulatory Visit: Payer: Self-pay | Admitting: Obstetrics and Gynecology

## 2016-11-01 DIAGNOSIS — Z1231 Encounter for screening mammogram for malignant neoplasm of breast: Secondary | ICD-10-CM

## 2016-11-16 ENCOUNTER — Ambulatory Visit
Admission: RE | Admit: 2016-11-16 | Discharge: 2016-11-16 | Disposition: A | Discharge status: Home / Self Care | Payer: No Typology Code available for payment source | Source: Ambulatory Visit | Attending: Obstetrics and Gynecology | Admitting: Obstetrics and Gynecology

## 2016-11-16 DIAGNOSIS — Z1231 Encounter for screening mammogram for malignant neoplasm of breast: Secondary | ICD-10-CM

## 2018-05-20 ENCOUNTER — Other Ambulatory Visit (HOSPITAL_COMMUNITY)
Admission: RE | Admit: 2018-05-20 | Discharge: 2018-05-20 | Disposition: A | Discharge status: Home / Self Care | Payer: PRIVATE HEALTH INSURANCE | Source: Ambulatory Visit | Attending: Obstetrics and Gynecology | Admitting: Obstetrics and Gynecology

## 2018-05-20 ENCOUNTER — Other Ambulatory Visit: Payer: Self-pay | Admitting: Obstetrics and Gynecology

## 2018-05-20 DIAGNOSIS — Z01411 Encounter for gynecological examination (general) (routine) with abnormal findings: Secondary | ICD-10-CM | POA: Insufficient documentation

## 2018-05-27 LAB — CYTOLOGY - PAP
Diagnosis: NEGATIVE
HPV: NOT DETECTED

## 2018-08-01 ENCOUNTER — Other Ambulatory Visit: Payer: Self-pay | Admitting: Obstetrics and Gynecology

## 2018-08-01 DIAGNOSIS — Z1231 Encounter for screening mammogram for malignant neoplasm of breast: Secondary | ICD-10-CM

## 2018-08-29 ENCOUNTER — Ambulatory Visit: Payer: No Typology Code available for payment source

## 2019-01-09 ENCOUNTER — Other Ambulatory Visit: Payer: Self-pay

## 2019-01-09 ENCOUNTER — Ambulatory Visit
Admission: RE | Admit: 2019-01-09 | Discharge: 2019-01-09 | Disposition: A | Discharge status: Home / Self Care | Payer: No Typology Code available for payment source | Source: Ambulatory Visit | Attending: Obstetrics and Gynecology | Admitting: Obstetrics and Gynecology

## 2019-01-09 DIAGNOSIS — Z1231 Encounter for screening mammogram for malignant neoplasm of breast: Secondary | ICD-10-CM

## 2019-02-08 ENCOUNTER — Encounter (HOSPITAL_COMMUNITY): Payer: Self-pay | Admitting: Emergency Medicine

## 2019-02-08 ENCOUNTER — Emergency Department (HOSPITAL_COMMUNITY)
Admission: EM | Admit: 2019-02-08 | Discharge: 2019-02-08 | Disposition: A | Discharge status: Home / Self Care | Payer: PRIVATE HEALTH INSURANCE | Attending: Emergency Medicine | Admitting: Emergency Medicine

## 2019-02-08 ENCOUNTER — Other Ambulatory Visit: Payer: Self-pay

## 2019-02-08 DIAGNOSIS — Z79899 Other long term (current) drug therapy: Secondary | ICD-10-CM | POA: Diagnosis not present

## 2019-02-08 DIAGNOSIS — J029 Acute pharyngitis, unspecified: Secondary | ICD-10-CM | POA: Insufficient documentation

## 2019-02-08 DIAGNOSIS — R590 Localized enlarged lymph nodes: Secondary | ICD-10-CM | POA: Diagnosis not present

## 2019-02-08 LAB — POC URINE PREG, ED: Preg Test, Ur: NEGATIVE

## 2019-02-08 LAB — GROUP A STREP BY PCR: Group A Strep by PCR: NOT DETECTED

## 2019-02-08 MED ORDER — LIDOCAINE VISCOUS HCL 2 % MT SOLN
15.0000 mL | Freq: Once | OROMUCOSAL | Status: AC
  Administered 2019-02-08: 15 mL via OROMUCOSAL
  Filled 2019-02-08: qty 15

## 2019-02-08 MED ORDER — ACETAMINOPHEN 325 MG PO TABS
650.0000 mg | ORAL_TABLET | Freq: Once | ORAL | Status: AC
  Administered 2019-02-08: 650 mg via ORAL
  Filled 2019-02-08: qty 2

## 2019-02-08 MED ORDER — LIDOCAINE VISCOUS HCL 2 % MT SOLN: 15.0000 mL | OROMUCOSAL | 0 refills | Status: AC | PRN

## 2019-02-08 MED ORDER — DEXAMETHASONE 1 MG/ML PO CONC
10.0000 mg | Freq: Once | ORAL | Status: AC
  Administered 2019-02-08: 10 mg via ORAL
  Filled 2019-02-08: qty 10

## 2019-02-08 NOTE — Discharge Instructions (Addendum)
You have been diagnosed today with viral pharyngitis.  At this time there does not appear to be the presence of an emergent medical condition, however there is always the potential for conditions to change. Please read and follow the below instructions.  Please return to the Emergency Department immediately for any new or worsening symptoms or if your symptoms do not improve within 2 days. Please be sure to follow up with your Primary Care Provider within one week regarding your visit today; please call their office to schedule an appointment even if you are feeling better for a follow-up visit. You have been given a medication called Decadron today, is a steroid medication that should help with your sore throat over the next several hours.  If you are diabetic be sure to monitor your blood sugars carefully as they can elevate after taking steroids. You may use the viscous lidocaine mouth rinse as prescribed to help with sore throat, do not swallow this medication, rinse and spit. You may use over-the-counter Tylenol and ibuprofen as directed on the packaging to help with your symptoms. Please be sure to drink plenty water and get plenty of rest over the next few days to help with your symptoms.  As we discussed you have refused COVID-19 testing today, please be sure to talk to your employer about potential COVID-19 testing.  Continue your self quarantine until you are symptom-free x7 days.  Get help right away if: You have trouble breathing. You cannot swallow fluids, soft foods, or your saliva. You have swelling in your throat or neck that gets worse. You keep feeling sick to your stomach (nauseous). You keep throwing up (vomiting). You have any new/concerning or worsening symptoms.  Please read the additional information packets attached to your discharge summary.  Do not take your medicine if  develop an itchy rash, swelling in your mouth or lips, or difficulty breathing; call 911 and seek  immediate emergency medical attention if this occurs.

## 2019-02-08 NOTE — ED Notes (Signed)
Patient verbalizes understanding of discharge instructions. Opportunity for questioning and answers were provided. Armband removed by staff, pt discharged from ED.  

## 2019-02-08 NOTE — ED Provider Notes (Signed)
[facility] DEPART[facility] Provider Note   CSN: [id number] Arrival date & time: 02/08/19  0744     History   Chief Complaint Chief Complaint  Patient presents with  . Sore Throat    HPI Desiree [name] is a 43 y.o. female presents today for sore throat that began last night.  Patient reports that she woke up from sleep around 1 AM and noticed that she had a sore throat that she described as a mild scratchy sensation when she swallowed bilaterally.  She drank some tea and went back to bed.  Patient reports that when she woke up again this morning for breakfast her sore throat had worsened.  She now describes a constant sensation bilateral moderate in intensity burning/scratching worsened with swallowing and without alleviating factors, no medication prior to arrival.  No radiation of pain.  Patient denies fever/chills, headache/vision changes, voice change, drooling, neck stiffness, swelling of the face/head/neck, chest pain/shortness of breath, nausea/vomiting, abdominal pain, sick contacts, new sexual contacts or any additional concerns.     HPI  Past Medical History:  Diagnosis Date  . Anemia    History as teenager  . Anxiety   . History of blood transfusion 1993   [place] - not sure number of units transfused  . SVD (spontaneous vaginal delivery)     Patient Active Problem List   Diagnosis Date Noted  . Pelvic adhesive disease 02/12/2014  . Abnormal uterine bleeding 02/12/2014    Past Surgical History:  Procedure Laterality Date  . DILATION AND CURETTAGE OF UTERUS    . HYSTEROSCOPY N/A 02/12/2014   Procedure: HYSTEROSCOPY WITH HYDROTHERMAL ABLATION;  Surgeon: Maeola Sarah. Landry Mellow, MD;  Location: [facility];  Service: Gynecology;  Laterality: N/A;  . LAPAROSCOPIC LYSIS OF ADHESIONS N/A 02/12/2014   Procedure: EXTENSIVE LAPAROSCOPIC LYSIS OF ADHESIONS;  Surgeon: Maeola Sarah. Landry Mellow, MD;  Location: [facility];  Service: Gynecology;  Laterality: N/A;  . LAPAROSCOPIC  TUBAL LIGATION Bilateral 02/12/2014   Procedure: ATTEMPTED  LAPAROSCOPIC TUBAL LIGATION;  Surgeon: Maeola Sarah. Landry Mellow, MD;  Location: [facility];  Service: Gynecology;  Laterality: Bilateral;  . LAPAROSCOPY     cervix     OB History   No obstetric history on file.      Home Medications    Prior to Admission medications   Medication Sig Start Date End Date Taking? Authorizing Provider  clonazePAM (KLONOPIN) 0.5 MG tablet Take 1 mg by mouth daily as needed. For anxiety    [provider]  lidocaine (XYLOCAINE) 2 % solution Use as directed 15 mLs in the mouth or throat as needed for mouth pain. Rinse and spit.  Do not swallow. 02/08/19   Deliah Boston, PA-C  phentermine (ADIPEX-P) 37.5 MG tablet Take 18.75 mg by mouth daily before breakfast.    [provider]  zolpidem (AMBIEN) 10 MG tablet Take 10 mg by mouth at bedtime as needed. For sleep    [provider]    Family History Family History  Problem Relation Age of Onset  . Breast cancer Paternal Aunt     Social History Social History   Tobacco Use  . Smoking status: Never Smoker  . Smokeless tobacco: Never Used  Substance Use Topics  . Alcohol use: Yes    Comment: daily - vodka 1-2 daily, weekends more  . Drug use: No     Allergies   Iodine, Shellfish allergy, Dicloxacillin, and Penicillins cross reactors   Review of Systems Review of Systems  Ten systems are reviewed and are negative for acute change except as noted in the HPI   Physical Exam Updated Vital Signs BP (!) 143/80 (BP Location: Right Arm)   Pulse 99   Temp 98.1 F (36.7 C) (Oral)   Resp 15   LMP 01/31/2019   SpO2 100%   Physical Exam Constitutional:      General: She is not in acute distress.    Appearance: Normal appearance. She is well-developed. She is not ill-appearing or diaphoretic.  HENT:     Head: Normocephalic and atraumatic.     Jaw: There is normal jaw occlusion. No trismus.     Right Ear: External ear  normal.     Left Ear: External ear normal.     Nose: Rhinorrhea present. Rhinorrhea is clear.     Right Sinus: No maxillary sinus tenderness or frontal sinus tenderness.     Left Sinus: No maxillary sinus tenderness or frontal sinus tenderness.     Mouth/Throat:     Comments: Posterior cobblestoning, mild erythema.  The patient has normal phonation and is in control of secretions. No stridor.  Midline uvula without edema. Soft palate rises symmetrically. No tonsillar swelling or exudates. Tongue protrusion is normal, floor of mouth is soft. No trismus. No creptius on neck palpation. No gingival erythema or fluctuance noted. Mucus membranes moist. No pallor noted. Eyes:     General: Vision grossly intact. Gaze aligned appropriately.     Extraocular Movements: Extraocular movements intact.     Conjunctiva/sclera: Conjunctivae normal.     Pupils: Pupils are equal, round, and reactive to light.  Neck:     Musculoskeletal: Full passive range of motion without pain, normal range of motion and neck supple.     Trachea: Trachea and phonation normal. No tracheal tenderness or tracheal deviation.  Pulmonary:     Effort: Pulmonary effort is normal. No respiratory distress.  Abdominal:     General: There is no distension.     Palpations: Abdomen is soft.     Tenderness: There is no abdominal tenderness. There is no guarding or rebound.  Musculoskeletal: Normal range of motion.  Lymphadenopathy:     Cervical: Cervical adenopathy present.     Right cervical: Superficial cervical adenopathy present. No posterior cervical adenopathy.    Left cervical: Superficial cervical adenopathy present. No posterior cervical adenopathy.  Skin:    General: Skin is warm and dry.  Neurological:     Mental Status: She is alert.     GCS: GCS eye subscore is 4. GCS verbal subscore is 5. GCS motor subscore is 6.     Comments: Speech is clear and goal oriented, follows commands Major Cranial nerves without deficit, no  facial droop Moves extremities without ataxia, coordination intact  Psychiatric:        Behavior: Behavior normal.    ED Treatments / Results  Labs (all labs ordered are listed, but only abnormal results are displayed) Labs Reviewed  GROUP A STREP BY PCR  POC URINE PREG, ED    EKG None  Radiology No results found.  Procedures Procedures (including critical care time)  Medications Ordered in ED Medications  lidocaine (XYLOCAINE) 2 % viscous mouth solution 15 mL (has no administration in time range)  dexamethasone (DECADRON) 1 MG/ML solution 10 mg (has no administration in time range)  acetaminophen (TYLENOL) tablet 650 mg (650 mg Oral Given 02/08/19 1138)     Initial Impression / Assessment and Plan / ED Course  I have  reviewed the triage vital signs and the nursing notes.  Pertinent labs & imaging results that were available during my care of the patient were reviewed by me and considered in my medical decision making (see chart for details).    Richrd PrimeDinisha R Crigler is a 43 y.o. female who presents today with a  Airway is patient, patient reports normal phonation and clinically patient does not sound muffled. Tonsils appear symmetric without exudate and their is no uvula deviation. No trismus on examination. Floor of the mouth is soft/nontender and their is no abnormal tongue protrusion. Patient is tolerating their own secretions without drooling and patient is able to drink here in the emergency department. Examination of the neck reveals mild anterior cervical lymphadenopathy. Abdominal exam reveals a soft/nontender abdomen without palpable LUQ mass or tenderness.  Strep test negative Urine Pregnancy Negative  Suspect that patient's sore throat due to viral pharyngitis. Do not suspect bacterial infection as etiology of symptoms today. No signs of Peritonsillar Abscess, Ludwig's Angina, Retropharyngeal Abscess, Dental Abscess or other deep tissue infections of the head or  neck.  Patient reports being in a monogamous relationship, do not suspect mononucleosis or gonococcal pharyngitis at this time.  Additionally do not suspect foreign body, epiglottitis or other emergent pathologies today.  There is no indication for antibiotics at this time.  Patient given 10 mg Decadron p.o. for symptomatic relief she denies history of diabetes, viscous lidocaine for symptomatic relief and Tylenol.  Patient does not appear dehydrated, but did discuss importance of water rehydration. Patient counseled on conservative home remedies for sore throat.  Additionally patient without respiratory symptoms, fever, cough or other symptoms suggestive of COVID-19 viral infection, do not suspect COVID-19 at this time patient to continue self quarantine until symptom-free for 7 days.  I have offered patient COVID-19 testing today as outpatient, she has refused and reports that her work will test her for this.  At this time there does not appear to be any evidence of an acute emergency medical condition and the patient appears stable for discharge with appropriate outpatient follow up. Diagnosis was discussed with patient who verbalizes understanding of care plan and is agreeable to discharge. I have discussed return precautions with patient who verbalizes understanding of return precautions. Patient encouraged to follow-up with their PCP. All questions answered. Patient has been discharged in good condition.  Richrd PrimeDinisha R Mabie was evaluated in Emergency Department on 02/08/2019 for the symptoms described in the history of present illness. She was evaluated in the context of the global COVID-19 pandemic, which necessitated consideration that the patient might be at risk for infection with the SARS-CoV-2 virus that causes COVID-19. Institutional protocols and algorithms that pertain to the evaluation of patients at risk for COVID-19 are in a state of rapid change based on information released by regulatory  bodies including the CDC and federal and state organizations. These policies and algorithms were followed during the patient's care in the ED.   Note: Portions of this report may have been transcribed using voice recognition software. Every effort was made to ensure accuracy; however, inadvertent computerized transcription errors may still be present. Final Clinical Impressions(s) / ED Diagnoses   Final diagnoses:  Viral pharyngitis    ED Discharge Orders         Ordered    lidocaine (XYLOCAINE) 2 % solution  As needed     02/08/19 1243           Bill SalinasMorelli, Narcissus Detwiler A, [place] JerseyPA-C 02/08/19 1244  Little, Ambrose Finland, MD 02/08/19 [phone]

## 2019-02-08 NOTE — ED Triage Notes (Signed)
Pt. Stated, I woke during the night and my throat felt really sore., it feels like something in in there. It hurts when I swallow.

## 2019-08-30 ENCOUNTER — Other Ambulatory Visit: Payer: Self-pay

## 2019-08-30 ENCOUNTER — Emergency Department (HOSPITAL_COMMUNITY)
Admission: EM | Admit: 2019-08-30 | Discharge: 2019-08-31 | Disposition: A | Discharge status: Home / Self Care | Payer: PRIVATE HEALTH INSURANCE | Attending: Emergency Medicine | Admitting: Emergency Medicine

## 2019-08-30 ENCOUNTER — Encounter (HOSPITAL_COMMUNITY): Payer: Self-pay | Admitting: Emergency Medicine

## 2019-08-30 DIAGNOSIS — R519 Headache, unspecified: Secondary | ICD-10-CM | POA: Insufficient documentation

## 2019-08-30 DIAGNOSIS — Z5321 Procedure and treatment not carried out due to patient leaving prior to being seen by health care provider: Secondary | ICD-10-CM | POA: Insufficient documentation

## 2019-08-30 NOTE — ED Triage Notes (Signed)
Pt st's she had a sharp pain in her head earlier today.  St's she was hypertensive on home machine.  Pt st's she still has a slight headache.  No hx of HTN

## 2019-08-30 NOTE — ED Notes (Signed)
No answer for VS x1 

## 2019-09-05 ENCOUNTER — Other Ambulatory Visit: Payer: Self-pay | Admitting: Internal Medicine

## 2019-09-05 ENCOUNTER — Other Ambulatory Visit: Payer: Self-pay | Admitting: Family Medicine

## 2019-09-05 ENCOUNTER — Ambulatory Visit
Admission: RE | Admit: 2019-09-05 | Discharge: 2019-09-05 | Disposition: A | Discharge status: Home / Self Care | Payer: PRIVATE HEALTH INSURANCE | Source: Ambulatory Visit | Attending: Internal Medicine | Admitting: Internal Medicine

## 2019-09-05 ENCOUNTER — Other Ambulatory Visit: Payer: Self-pay | Admitting: Obstetrics and Gynecology

## 2019-09-05 DIAGNOSIS — G4452 New daily persistent headache (NDPH): Secondary | ICD-10-CM

## 2019-09-05 DIAGNOSIS — N946 Dysmenorrhea, unspecified: Secondary | ICD-10-CM

## 2019-09-15 ENCOUNTER — Ambulatory Visit
Admission: RE | Admit: 2019-09-15 | Discharge: 2019-09-15 | Disposition: A | Discharge status: Home / Self Care | Payer: PRIVATE HEALTH INSURANCE | Source: Ambulatory Visit | Attending: Obstetrics and Gynecology | Admitting: Obstetrics and Gynecology

## 2019-09-15 ENCOUNTER — Other Ambulatory Visit: Payer: PRIVATE HEALTH INSURANCE

## 2019-09-15 DIAGNOSIS — N946 Dysmenorrhea, unspecified: Secondary | ICD-10-CM

## 2020-03-04 ENCOUNTER — Other Ambulatory Visit: Payer: Self-pay | Admitting: Obstetrics and Gynecology

## 2020-03-04 DIAGNOSIS — Z1231 Encounter for screening mammogram for malignant neoplasm of breast: Secondary | ICD-10-CM

## 2020-04-01 ENCOUNTER — Other Ambulatory Visit: Payer: Self-pay

## 2020-04-01 ENCOUNTER — Ambulatory Visit
Admission: RE | Admit: 2020-04-01 | Discharge: 2020-04-01 | Disposition: A | Discharge status: Home / Self Care | Payer: PRIVATE HEALTH INSURANCE | Source: Ambulatory Visit | Attending: Obstetrics and Gynecology | Admitting: Obstetrics and Gynecology

## 2020-04-01 DIAGNOSIS — Z1231 Encounter for screening mammogram for malignant neoplasm of breast: Secondary | ICD-10-CM

## 2021-07-29 ENCOUNTER — Other Ambulatory Visit: Payer: Self-pay | Admitting: Obstetrics and Gynecology

## 2021-07-29 ENCOUNTER — Other Ambulatory Visit (HOSPITAL_COMMUNITY)
Admission: RE | Admit: 2021-07-29 | Discharge: 2021-07-29 | Disposition: A | Discharge status: Home / Self Care | Payer: No Typology Code available for payment source | Source: Ambulatory Visit | Attending: Obstetrics and Gynecology | Admitting: Obstetrics and Gynecology

## 2021-07-29 DIAGNOSIS — Z01419 Encounter for gynecological examination (general) (routine) without abnormal findings: Secondary | ICD-10-CM | POA: Insufficient documentation

## 2021-08-02 LAB — CYTOLOGY - PAP
Comment: NEGATIVE
Diagnosis: NEGATIVE
High risk HPV: NEGATIVE

## 2021-08-02 IMAGING — CT CT HEAD W/O CM
3 of 4 series · 16 of 47 positions shown, 19 images · non-contrast
Comparison: None.

CLINICAL DATA: Headache.

EXAM:
CT HEAD WITHOUT CONTRAST
TECHNIQUE: Contiguous axial images were obtained from the base of the skull
through the vertex without intravenous contrast.

[Series 2: head 5.00 hr40 s3 axial ibhc · axial · 0.43mm/px · z∈[-659,-524]mm · 10 of 33 slices shown, 13 images]
[im 3/33  brain]
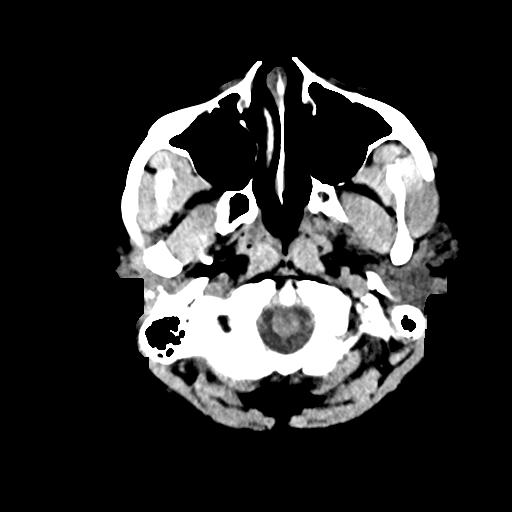
[im 3/33  bone]
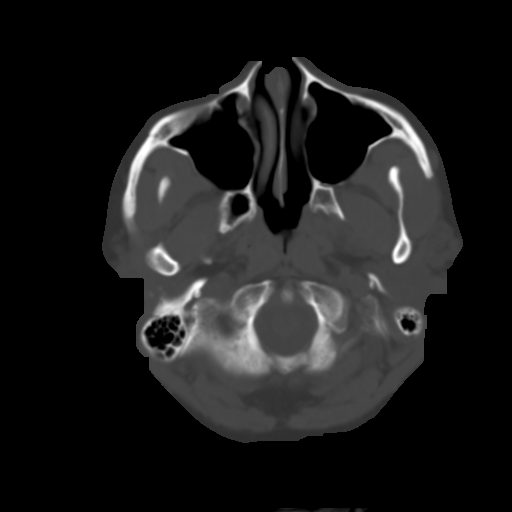
[im 5/33  brain]
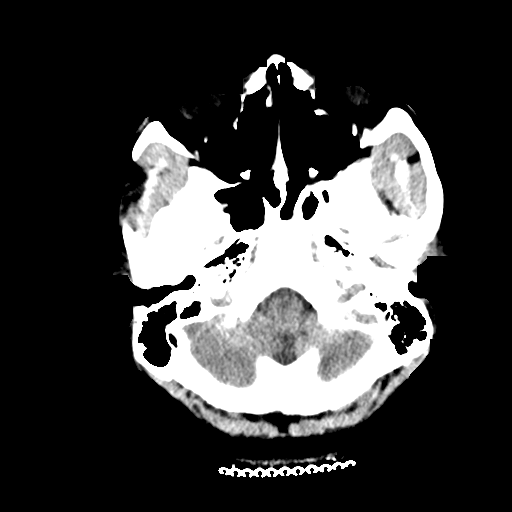
[im 10/33  brain]
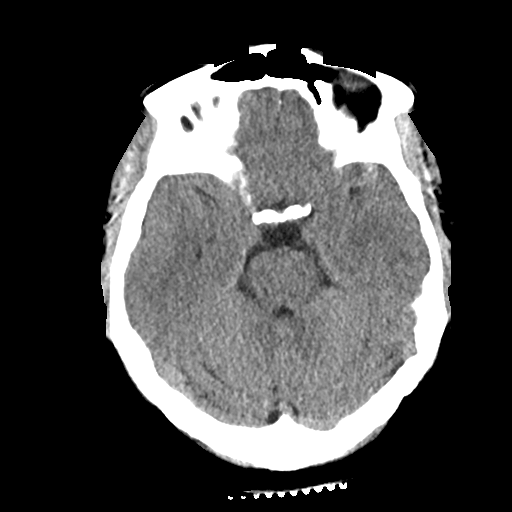
[im 12/33  brain]
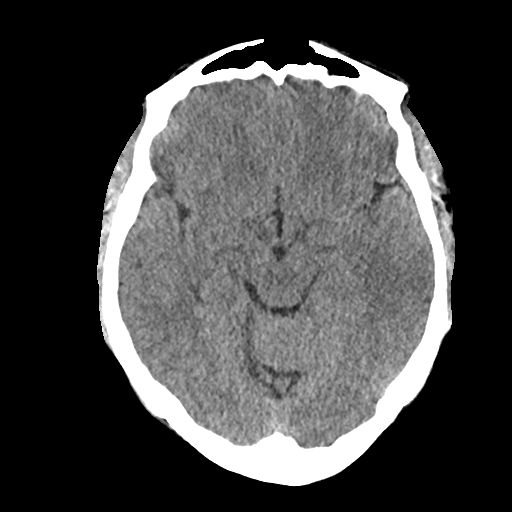
[im 14/33  brain]
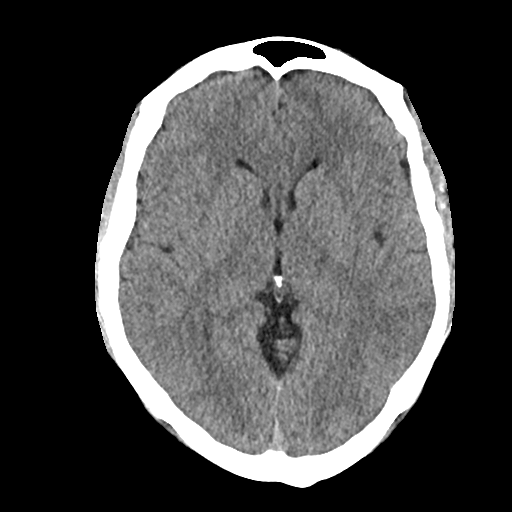
[im 14/33  bone]
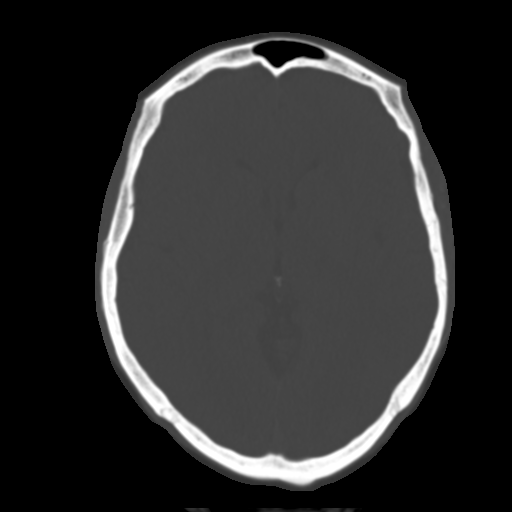
[im 19/33  brain]
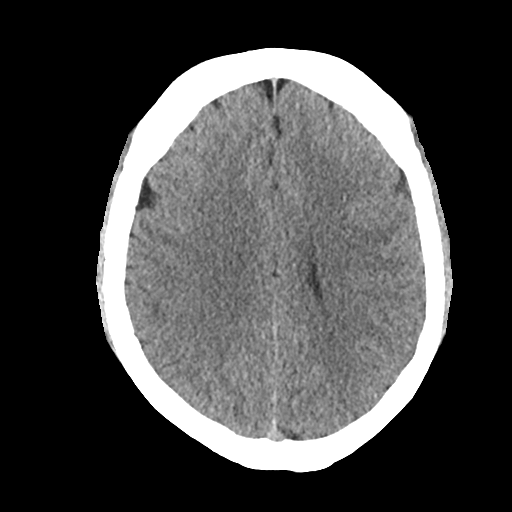
[im 21/33  brain]
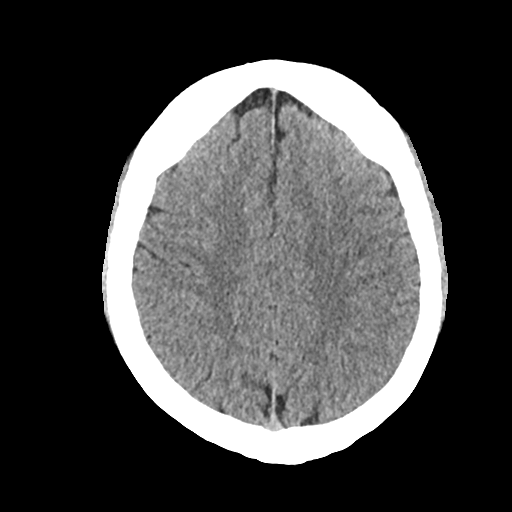
[im 23/33  brain]
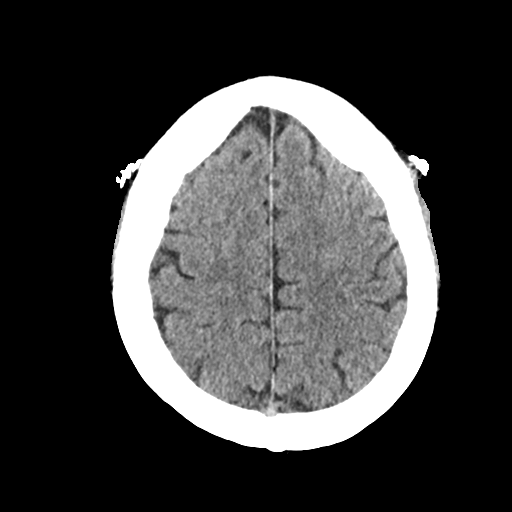
[im 28/33  brain]
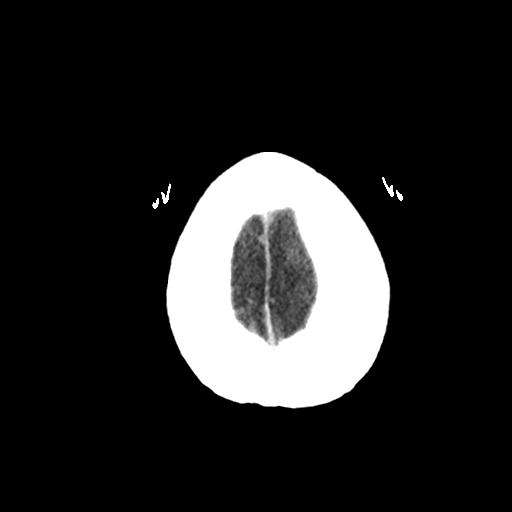
[im 28/33  bone]
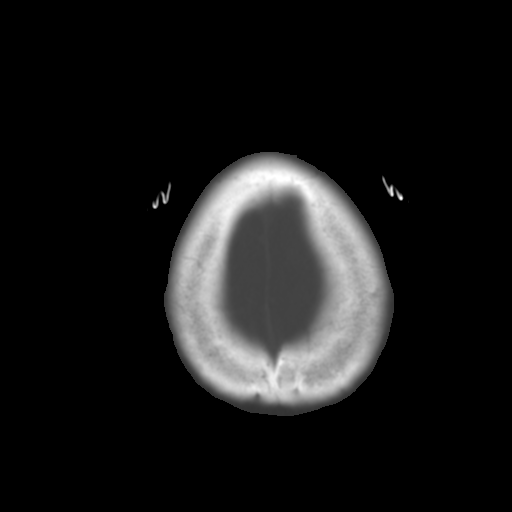
[im 30/33  brain]
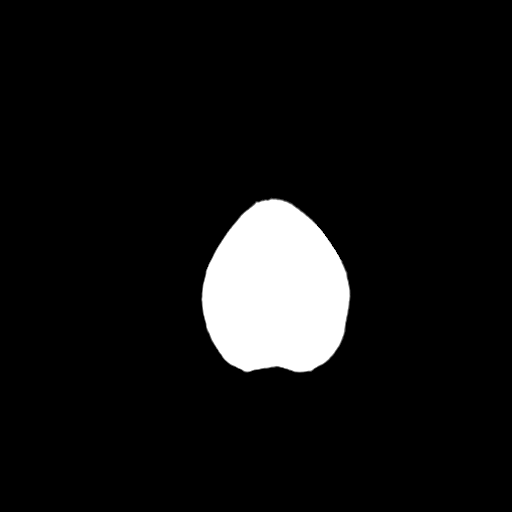

[Series 4: head 3.00 hr40 s3 sag · sagittal · 0.31mm/px · 3 of 60 slices shown]
[im 20/60  brain]
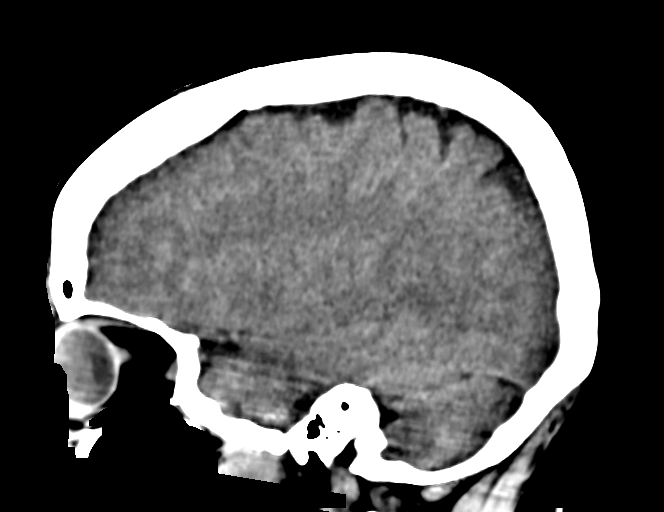
[im 30/60  brain]
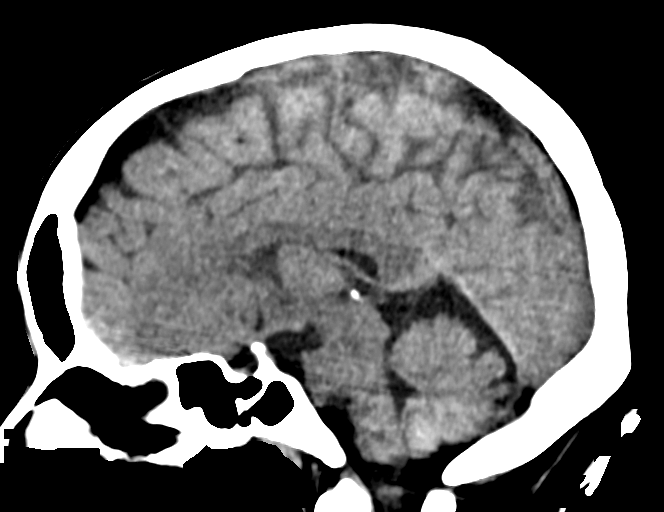
[im 40/60  brain]
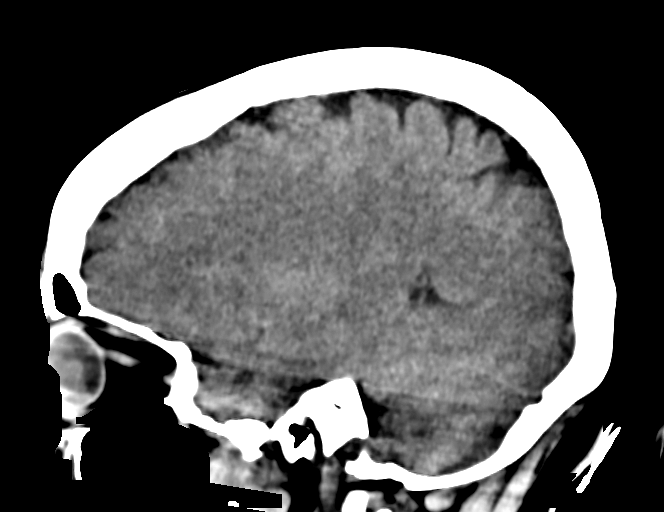

[Series 6: head 3.00 hr40 s3 cor · coronal · 0.31mm/px · 3 of 68 slices shown]
[im 23/68  brain]
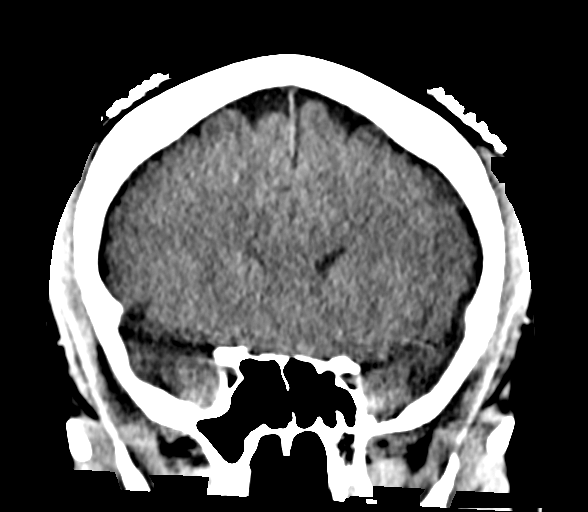
[im 30/68  brain]
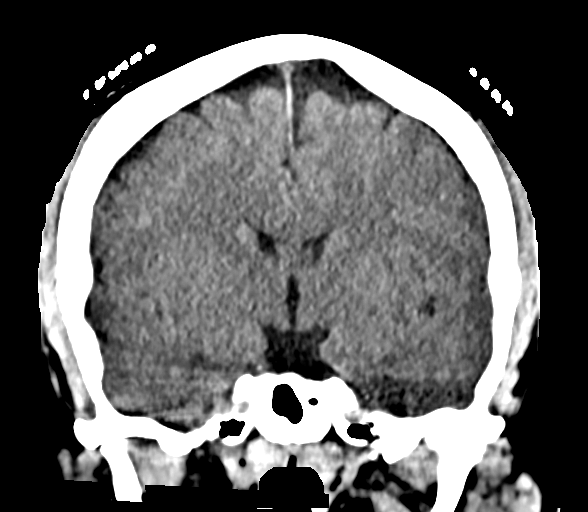
[im 38/68  brain]
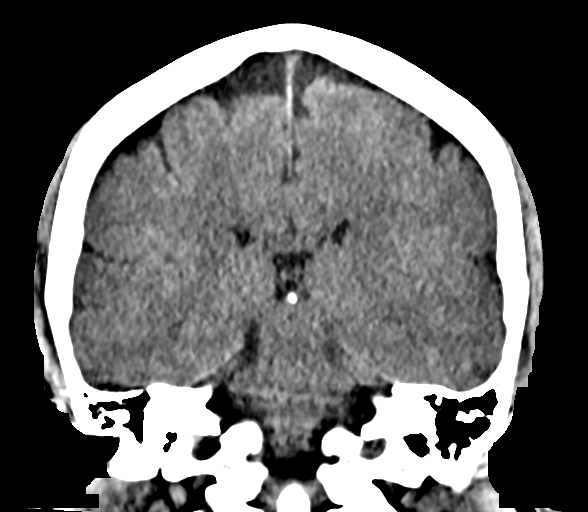

[16 of 47 positions shown; findings below may reference images not displayed]

FINDINGS: Brain: No evidence of acute infarction, hemorrhage, hydrocephalus,
extra-axial collection or mass lesion/mass effect.

Vascular: No hyperdense vessel or unexpected calcification.

Skull: Normal. Negative for fracture or focal lesion.

Sinuses/Orbits: No acute finding.

Other: None.
IMPRESSION: Normal head CT.

## 2021-12-01 ENCOUNTER — Other Ambulatory Visit: Payer: Self-pay

## 2021-12-01 MED ORDER — MEDROXYPROGESTERONE ACETATE 150 MG/ML IM SUSY
PREFILLED_SYRINGE | INTRAMUSCULAR | 3 refills | Status: AC
  Filled 2021-12-01: qty 1, 90d supply, fill #0

## 2022-02-23 ENCOUNTER — Other Ambulatory Visit: Payer: Self-pay | Admitting: Obstetrics and Gynecology

## 2022-02-23 DIAGNOSIS — Z1231 Encounter for screening mammogram for malignant neoplasm of breast: Secondary | ICD-10-CM

## 2022-03-10 ENCOUNTER — Ambulatory Visit: Payer: No Typology Code available for payment source

## 2022-03-31 ENCOUNTER — Ambulatory Visit
Admission: RE | Admit: 2022-03-31 | Discharge: 2022-03-31 | Disposition: A | Discharge status: Home / Self Care | Payer: No Typology Code available for payment source | Source: Ambulatory Visit | Attending: Obstetrics and Gynecology | Admitting: Obstetrics and Gynecology

## 2022-03-31 DIAGNOSIS — Z1231 Encounter for screening mammogram for malignant neoplasm of breast: Secondary | ICD-10-CM

## 2023-12-14 ENCOUNTER — Encounter: Payer: Self-pay | Admitting: Advanced Practice Midwife

## 2023-12-31 ENCOUNTER — Other Ambulatory Visit: Payer: Self-pay | Admitting: Obstetrics and Gynecology

## 2023-12-31 DIAGNOSIS — Z1231 Encounter for screening mammogram for malignant neoplasm of breast: Secondary | ICD-10-CM

## 2024-01-17 ENCOUNTER — Ambulatory Visit
Admission: RE | Admit: 2024-01-17 | Discharge: 2024-01-17 | Disposition: A | Discharge status: Home / Self Care | Source: Ambulatory Visit | Attending: Obstetrics and Gynecology | Admitting: Obstetrics and Gynecology

## 2024-01-17 DIAGNOSIS — Z1231 Encounter for screening mammogram for malignant neoplasm of breast: Secondary | ICD-10-CM
